# Patient Record
Sex: Male | Born: 1955 | Race: Black or African American | Hispanic: No | Marital: Single | Smoking: Never smoker
Health system: Southern US, Community
[De-identification: ages and names within clinical notes are randomized; demographics above are authoritative.]

## PROBLEM LIST (undated history)

## (undated) DIAGNOSIS — K449 Diaphragmatic hernia without obstruction or gangrene: Secondary | ICD-10-CM

## (undated) HISTORY — PX: SHOULDER SURGERY: SHX246

---

## 2010-08-09 ENCOUNTER — Ambulatory Visit: Payer: Worker's Compensation

## 2011-04-22 ENCOUNTER — Emergency Department (HOSPITAL_COMMUNITY): Payer: Medicaid Other

## 2011-04-22 ENCOUNTER — Emergency Department (HOSPITAL_COMMUNITY)
Admission: EM | Admit: 2011-04-22 | Discharge: 2011-04-22 | Disposition: A | Discharge status: Home / Self Care | Payer: Medicaid Other | Attending: Emergency Medicine | Admitting: Emergency Medicine

## 2011-04-22 ENCOUNTER — Encounter: Payer: Self-pay | Admitting: Emergency Medicine

## 2011-04-22 DIAGNOSIS — R209 Unspecified disturbances of skin sensation: Secondary | ICD-10-CM | POA: Insufficient documentation

## 2011-04-22 DIAGNOSIS — M5430 Sciatica, unspecified side: Secondary | ICD-10-CM | POA: Insufficient documentation

## 2011-04-22 DIAGNOSIS — M79609 Pain in unspecified limb: Secondary | ICD-10-CM | POA: Insufficient documentation

## 2011-04-22 DIAGNOSIS — M5432 Sciatica, left side: Secondary | ICD-10-CM

## 2011-04-22 MED ORDER — PREDNISONE 10 MG PO TABS: 20.0000 mg | ORAL_TABLET | Freq: Two times a day (BID) | ORAL | Status: DC
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
## 2011-04-22 NOTE — ED Notes (Signed)
Pt has pain to lt leg from buttocks area to lower leg with numbness states that it has been going on intermit for the past month

## 2011-04-22 NOTE — ED Notes (Signed)
Patient transported to X-ray and back to room.

## 2011-04-23 NOTE — ED Provider Notes (Signed)
Medical screening examination/treatment/procedure(s) were performed by non-physician practitioner and as supervising physician I was immediately available for consultation/collaboration.   Breigh Annett, MD 04/23/11 1839 

## 2012-07-27 ENCOUNTER — Emergency Department (HOSPITAL_COMMUNITY)
Admission: EM | Admit: 2012-07-27 | Discharge: 2012-07-27 | Disposition: A | Discharge status: Home / Self Care | Payer: Medicare Other | Attending: Emergency Medicine | Admitting: Emergency Medicine

## 2012-07-27 ENCOUNTER — Encounter (HOSPITAL_COMMUNITY): Payer: Self-pay | Admitting: *Deleted

## 2012-07-27 ENCOUNTER — Emergency Department (HOSPITAL_COMMUNITY): Payer: Medicare Other

## 2012-07-27 DIAGNOSIS — IMO0002 Reserved for concepts with insufficient information to code with codable children: Secondary | ICD-10-CM | POA: Insufficient documentation

## 2012-07-27 DIAGNOSIS — J45909 Unspecified asthma, uncomplicated: Secondary | ICD-10-CM | POA: Insufficient documentation

## 2012-07-27 DIAGNOSIS — Y9389 Activity, other specified: Secondary | ICD-10-CM | POA: Insufficient documentation

## 2012-07-27 DIAGNOSIS — S20219A Contusion of unspecified front wall of thorax, initial encounter: Secondary | ICD-10-CM

## 2012-07-27 DIAGNOSIS — Y929 Unspecified place or not applicable: Secondary | ICD-10-CM | POA: Insufficient documentation

## 2012-07-27 LAB — POCT I-STAT, CHEM 8
Calcium, Ion: 1.21 mmol/L (ref 1.12–1.23)
Glucose, Bld: 112 mg/dL — ABNORMAL HIGH (ref 70–99)
HCT: 48 % (ref 39.0–52.0)
Hemoglobin: 16.3 g/dL (ref 13.0–17.0)
TCO2: 28 mmol/L (ref 0–100)

## 2012-07-27 LAB — CBC
HCT: 42.4 % (ref 39.0–52.0)
MCH: 29.7 pg (ref 26.0–34.0)
MCHC: 35.1 g/dL (ref 30.0–36.0)
MCV: 84.5 fL (ref 78.0–100.0)
Platelets: 135 10*3/uL — ABNORMAL LOW (ref 150–400)
RDW: 12.2 % (ref 11.5–15.5)
WBC: 3.4 10*3/uL — ABNORMAL LOW (ref 4.0–10.5)

## 2012-07-27 LAB — POCT I-STAT TROPONIN I: Troponin i, poc: 0.01 ng/mL (ref 0.00–0.08)
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
## 2012-07-27 NOTE — ED Notes (Addendum)
Pt states he has intermittent central cp with no radiation for the past few days. Describes pain as pressure denies any pain at this moment but states the first day pain was a 7/10 but has decreased in severity since then. Pt reports he does not know if pain comes when he does any activity, he did not pay attention. Pt alert and oriented x 4. No distress at this time.

## 2012-07-27 NOTE — ED Notes (Signed)
Pt with no cardiac hx to ED c/o sternal chest pain that radiates to L chest.  Denies increased pain on inspiration, denies sob, diaphoresis.  States pain increased with palpation and when he stood.

## 2012-11-13 ENCOUNTER — Emergency Department (HOSPITAL_COMMUNITY): Payer: Medicare Other

## 2012-11-13 ENCOUNTER — Encounter (HOSPITAL_COMMUNITY): Payer: Self-pay | Admitting: *Deleted

## 2012-11-13 ENCOUNTER — Emergency Department (HOSPITAL_COMMUNITY)
Admission: EM | Admit: 2012-11-13 | Discharge: 2012-11-13 | Disposition: A | Discharge status: Home / Self Care | Payer: Medicare Other | Attending: Emergency Medicine | Admitting: Emergency Medicine

## 2012-11-13 DIAGNOSIS — M545 Low back pain, unspecified: Secondary | ICD-10-CM | POA: Insufficient documentation

## 2012-11-13 DIAGNOSIS — M549 Dorsalgia, unspecified: Secondary | ICD-10-CM

## 2012-11-13 DIAGNOSIS — Z9889 Other specified postprocedural states: Secondary | ICD-10-CM | POA: Insufficient documentation

## 2012-11-13 DIAGNOSIS — Z79899 Other long term (current) drug therapy: Secondary | ICD-10-CM | POA: Insufficient documentation

## 2012-11-13 DIAGNOSIS — J45909 Unspecified asthma, uncomplicated: Secondary | ICD-10-CM | POA: Insufficient documentation

## 2012-11-13 MED ORDER — CYCLOBENZAPRINE HCL 10 MG PO TABS: 10.0000 mg | ORAL_TABLET | Freq: Three times a day (TID) | ORAL | Status: DC | PRN

## 2012-11-13 MED ORDER — DIAZEPAM 5 MG PO TABS
5.0000 mg | ORAL_TABLET | Freq: Once | ORAL | Status: AC
  Administered 2012-11-13: 5 mg via ORAL
  Filled 2012-11-13: qty 1

## 2012-11-13 MED ORDER — NAPROXEN 500 MG PO TABS: 500.0000 mg | ORAL_TABLET | Freq: Two times a day (BID) | ORAL | Status: DC

## 2012-11-13 MED ORDER — KETOROLAC TROMETHAMINE 60 MG/2ML IM SOLN
60.0000 mg | Freq: Once | INTRAMUSCULAR | Status: DC
  Filled 2012-11-13: qty 2

## 2012-11-13 NOTE — ED Provider Notes (Signed)
History    CSN: [id number] Arrival date & time 11/13/12  0844  First MD Initiated Contact with Patient 11/13/12 0901     Chief Complaint  Patient presents with  . Back Pain   (Consider location/radiation/quality/duration/timing/severity/associated sxs/prior Treatment) HPI Comments: Patient is a 57 year old male who presents with sudden onset of lower back pain that started 3 weeks ago after pulling a heavy suitcase uphill. The pain is aching and severe and does not radiate. The pain is constant. Movement makes the pain worse. Nothing makes the pain better. Patient has tried applying heat to the affected area which has provided some relief. No associated symptoms. No saddles paresthesias or bladder/bowel incontinence.     Past Medical History  Diagnosis Date  . Asthma    Past Surgical History  Procedure Laterality Date  . Shoulder surgery Left    History reviewed. No pertinent family history. History  Substance Use Topics  . Smoking status: Never Smoker   . Smokeless tobacco: Not on file  . Alcohol Use: No    Review of Systems  Musculoskeletal: Positive for back pain.  All other systems reviewed and are negative.    Allergies  Review of patient's allergies indicates no known allergies.  Home Medications   Current Outpatient Rx  Name  Route  Sig  Dispense  Refill  . Cholecalciferol (VITAMIN D-3) 5000 UNITS TABS   Oral   Take 5,000 Units by mouth every morning.         . Multiple Vitamin (MULTIVITAMIN WITH MINERALS) TABS   Oral   Take 1 tablet by mouth 2 (two) times daily.          . naproxen sodium (ALEVE) 220 MG tablet   Oral   Take 440 mg by mouth every 8 (eight) hours as needed (For back pain.).         Marland Kitchen tetrahydrozoline 0.05 % ophthalmic solution   Both Eyes   Place 1-2 drops into both eyes 4 (four) times daily as needed (For allergies.).         Marland Kitchen vitamin C (ASCORBIC ACID) 500 MG tablet   Oral   Take 500 mg by mouth 2 (two) times daily.          BP 132/88  Pulse 57  Temp(Src) 98.7 F (37.1 C) (Oral)  Resp 16  SpO2 99% Physical Exam  Nursing note and vitals reviewed. Constitutional: He is oriented to person, place, and time. He appears well-developed and well-nourished. No distress.  HENT:  Head: Normocephalic and atraumatic.  Eyes: Conjunctivae are normal.  Neck: Normal range of motion.  Cardiovascular: Normal rate and regular rhythm.  Exam reveals no gallop and no friction rub.   No murmur heard. Pulmonary/Chest: Effort normal and breath sounds normal. He has no wheezes. He has no rales. He exhibits no tenderness.  Abdominal: Soft. He exhibits no distension. There is no tenderness. There is no rebound.  Musculoskeletal: Normal range of motion.  No midline spine tenderness. Left lower paraspinal tenderness to palpation.   Neurological: He is alert and oriented to person, place, and time. Coordination normal.  Lower extremity strength and sensation equal and intact bilaterally. Speech is goal-oriented. Moves limbs without ataxia.   Skin: Skin is warm and dry.  Psychiatric: He has a normal mood and affect. His behavior is normal.    ED Course  Procedures (including critical care time) Labs Reviewed - No data to display Dg Lumbar Spine Complete  11/13/2012   *RADIOLOGY  REPORT*  Clinical Data: Back pain.  LUMBAR SPINE - COMPLETE 4+ VIEW  Comparison: No priors.  Findings: Five views of the lumbar spine demonstrate no definite acute displaced fractures or compression type fractures.  There is multilevel degenerative disc disease, most severe at L5-S1. Multilevel facet arthropathy is also noted throughout the lumbar spine.  5 mm of retrolisthesis of L5 upon S1.  Alignment is otherwise anatomic.  No definite defects of the pars interarticularis are noted.  IMPRESSION: 1.  No acute radiographic abnormality of the lumbar spine. 2.  Mild multilevel degenerative disc disease and lumbar spondylosis, as above, including 5 mm of  retrolisthesis of L5 upon S1.   Original Report Authenticated By: Trudie Reed, M.D.   1. Back pain     MDM  9:41 AM Patient will have Toradol and valium for pain. Xray of lumbar spine pending.   10:12 AM Xray unremarkable for acute changes. Patient will have Naprosyn and Flexeril prescriptions for pain. Patient likely having pain from muscle strain. No bladder/bowel incontinence or saddle paresthesias. Vitals stable and patient afebrile.   Emilia Beck, PA-C 11/13/12 1018

## 2012-11-13 NOTE — ED Provider Notes (Signed)
Medical screening examination/treatment/procedure(s) were performed by non-physician practitioner and as supervising physician I was immediately available for consultation/collaboration.  Yomaris Palecek, MD 11/13/12 1558 

## 2012-11-13 NOTE — ED Notes (Signed)
Pt reports back pain x3 weeks, thinks he hurt his back from pulling heavy suitcase. Reports pain is not allowing him to sleep. Pain is okay with sitting up.

## 2014-01-27 ENCOUNTER — Encounter (HOSPITAL_COMMUNITY): Payer: Self-pay | Admitting: Emergency Medicine

## 2014-01-27 ENCOUNTER — Emergency Department (HOSPITAL_COMMUNITY)
Admission: EM | Admit: 2014-01-27 | Discharge: 2014-01-27 | Disposition: A | Discharge status: Home / Self Care | Payer: Medicare HMO | Attending: Emergency Medicine | Admitting: Emergency Medicine

## 2014-01-27 DIAGNOSIS — Z79899 Other long term (current) drug therapy: Secondary | ICD-10-CM | POA: Insufficient documentation

## 2014-01-27 DIAGNOSIS — R110 Nausea: Secondary | ICD-10-CM | POA: Diagnosis present

## 2014-01-27 DIAGNOSIS — J45909 Unspecified asthma, uncomplicated: Secondary | ICD-10-CM | POA: Insufficient documentation

## 2014-01-27 DIAGNOSIS — Z791 Long term (current) use of non-steroidal anti-inflammatories (NSAID): Secondary | ICD-10-CM | POA: Diagnosis not present

## 2014-01-27 LAB — CBC WITH DIFFERENTIAL/PLATELET
BASOS ABS: 0 10*3/uL (ref 0.0–0.1)
Basophils Relative: 1 % (ref 0–1)
Eosinophils Absolute: 0.1 10*3/uL (ref 0.0–0.7)
Eosinophils Relative: 2 % (ref 0–5)
HEMATOCRIT: 43.8 % (ref 39.0–52.0)
HEMOGLOBIN: 15.2 g/dL (ref 13.0–17.0)
LYMPHS PCT: 49 % — AB (ref 12–46)
Lymphs Abs: 1.6 10*3/uL (ref 0.7–4.0)
MCH: 29.8 pg (ref 26.0–34.0)
MCHC: 34.7 g/dL (ref 30.0–36.0)
MCV: 85.9 fL (ref 78.0–100.0)
MONO ABS: 0.1 10*3/uL (ref 0.1–1.0)
MONOS PCT: 5 % (ref 3–12)
NEUTROS ABS: 1.3 10*3/uL — AB (ref 1.7–7.7)
Neutrophils Relative %: 43 % (ref 43–77)
Platelets: 161 10*3/uL (ref 150–400)
RBC: 5.1 MIL/uL (ref 4.22–5.81)
RDW: 11.3 % — ABNORMAL LOW (ref 11.5–15.5)
WBC: 3.1 10*3/uL — AB (ref 4.0–10.5)

## 2014-01-27 LAB — COMPREHENSIVE METABOLIC PANEL
ALT: 13 U/L (ref 0–53)
AST: 20 U/L (ref 0–37)
Albumin: 4.1 g/dL (ref 3.5–5.2)
Alkaline Phosphatase: 67 U/L (ref 39–117)
Anion gap: 13 (ref 5–15)
BUN: 11 mg/dL (ref 6–23)
CO2: 25 mEq/L (ref 19–32)
Calcium: 9.4 mg/dL (ref 8.4–10.5)
Chloride: 105 mEq/L (ref 96–112)
Creatinine, Ser: 1.11 mg/dL (ref 0.50–1.35)
GFR calc Af Amer: 84 mL/min — ABNORMAL LOW (ref >90)
GFR calc non Af Amer: 72 mL/min — ABNORMAL LOW (ref >90)
Glucose, Bld: 85 mg/dL (ref 70–99)
Potassium: 4.2 mEq/L (ref 3.7–5.3)
Sodium: 143 mEq/L (ref 137–147)
Total Bilirubin: 1 mg/dL (ref 0.3–1.2)
Total Protein: 7.7 g/dL (ref 6.0–8.3)

## 2014-01-27 LAB — I-STAT TROPONIN, ED
Troponin i, poc: 0 ng/mL (ref 0.00–0.08)
Troponin i, poc: 0 ng/mL (ref 0.00–0.08)

## 2014-01-27 MED ORDER — ONDANSETRON 4 MG PO TBDP
4.0000 mg | ORAL_TABLET | Freq: Once | ORAL | Status: AC
  Administered 2014-01-27: 4 mg via ORAL
  Filled 2014-01-27: qty 1

## 2014-01-27 MED ORDER — ONDANSETRON HCL 4 MG PO TABS: 4.0000 mg | ORAL_TABLET | Freq: Four times a day (QID) | ORAL | Status: DC

## 2014-01-27 NOTE — ED Notes (Signed)
Pt monitored by pulse ox, bp cuff, and 12-lead. 

## 2014-01-27 NOTE — ED Provider Notes (Signed)
CSN: [id number]     Arrival date & time 01/27/14  1553 History   First MD Initiated Contact with Patient 01/27/14 1657     Chief Complaint  Patient presents with  . Nausea     (Consider location/radiation/quality/duration/timing/severity/associated sxs/prior Treatment) HPI  A 58 year old male with history of asthma who presents complaining of nausea. Patient reports he drinks carrot juice on a regular basis. Today he went to the gas station and got another carrot juice to drink.  While drinking pt felt there's something in the carrot juice "I see thing floating in it and it tastes bad, not like it usually taste".  Shortly after drinking the juice he felt nauseated.  He report "there's a gasoline smell to it and i would like to know what it is".  Despite nausea, and spitting up the carrot juice approximately 2-3 hrs ago he denies vomiting.  Approximately an hr ago while waiting in the ER pt report feeling SOB and having CP.  CP is midsternal, non radiating, no associated lightheadedness, dizziness, or diaphoresis.  Does not have any significant cardiac history, pt is a non smoker, no hx of diabetes.  Pain has subsided.  Denies any active shortness of breath. Patient did report [place] up to [place] 2 weeks ago by car but has no complications. No active cancer, no prior history of PE or DVT, no recent surgery, no unilateral leg swelling or calf pain.  Furthermore patient denies fever, active vomiting, diarrhea, abdominal cramping, or rash. Patient brought the bottle of carrot juice with him.  Past Medical History  Diagnosis Date  . Asthma    Past Surgical History  Procedure Laterality Date  . Shoulder surgery Left    History reviewed. No pertinent family history. History  Substance Use Topics  . Smoking status: Never Smoker   . Smokeless tobacco: Not on file  . Alcohol Use: No    Review of Systems  All other systems reviewed and are negative.     Allergies  Review of  patient's allergies indicates no known allergies.  Home Medications   Prior to Admission medications   Medication Sig Start Date End Date Taking? Authorizing Provider  Cholecalciferol (VITAMIN D-3) 5000 UNITS TABS Take 5,000 Units by mouth every morning.    Historical Provider, MD  cyclobenzaprine (FLEXERIL) 10 MG tablet Take 1 tablet (10 mg total) by mouth 3 (three) times daily as needed for muscle spasms. 11/13/12   Emilia BeckKaitlyn Szekalski, PA-C  Multiple Vitamin (MULTIVITAMIN WITH MINERALS) TABS Take 1 tablet by mouth 2 (two) times daily.     Historical Provider, MD  naproxen (NAPROSYN) 500 MG tablet Take 1 tablet (500 mg total) by mouth 2 (two) times daily with a meal. 11/13/12   Emilia BeckKaitlyn Szekalski, PA-C  naproxen sodium (ALEVE) 220 MG tablet Take 440 mg by mouth every 8 (eight) hours as needed (For back pain.).    Historical Provider, MD  tetrahydrozoline 0.05 % ophthalmic solution Place 1-2 drops into both eyes 4 (four) times daily as needed (For allergies.).    Historical Provider, MD  vitamin C (ASCORBIC ACID) 500 MG tablet Take 500 mg by mouth 2 (two) times daily.     Historical Provider, MD   BP 128/83  Pulse 84  Temp(Src) 98.4 F (36.9 C) (Oral)  Resp 18  Ht 5\' 11"  (1.803 m)  Wt 197 lb 9 oz (89.614 kg)  BMI 27.57 kg/m2  SpO2 97% Physical Exam  Constitutional: He is oriented to person, place, and time. He  appears well-developed and well-nourished. No distress.  HENT:  Head: Atraumatic.  Mouth/Throat: Oropharynx is clear and moist.  Eyes: Conjunctivae are normal.  Neck: Normal range of motion. Neck supple.  Cardiovascular: Normal rate and regular rhythm.   Pulmonary/Chest: Effort normal and breath sounds normal.  Abdominal: Soft. There is no tenderness.  Neurological: He is alert and oriented to person, place, and time.  Skin: No rash noted.  Psychiatric: He has a normal mood and affect.    ED Course  Procedures (including critical care time)  5:17 PM Pt developed nausea  after he drank carrot juice from a gas station.  Pt brought the bottle with him.  The juice will expired on 02/19/2014.  However there are a lot of sedimentation in the bottle, which is unusual according to him.  Will give zofran.  Pt has transient cp/sob that is atypical for ACS.  sxs likely from anxiety of drinking the carrot juice.  TIMI 0.  PERC negative.  Reassurance given.  Stable for discharge.  Return precaution discussed.     Labs Review Labs Reviewed  CBC WITH DIFFERENTIAL - Abnormal; Notable for the following:    WBC 3.1 (*)    RDW 11.3 (*)    Neutro Abs 1.3 (*)    Lymphocytes Relative 49 (*)    All other components within normal limits  COMPREHENSIVE METABOLIC PANEL - Abnormal; Notable for the following:    GFR calc non Af Amer 72 (*)    GFR calc Af Amer 84 (*)    All other components within normal limits  I-STAT TROPOININ, ED  I-STAT TROPOININ, ED    Imaging Review No results found.   EKG Interpretation   Date/Time:  Monday January 27 2014 16:13:23 EDT Ventricular Rate:  77 PR Interval:  180 QRS Duration: 84 QT Interval:  356 QTC Calculation: 402 R Axis:   102 Text Interpretation:  Normal sinus rhythm Rightward axis Borderline ECG  When compared with ECG of 07/27/2012 No significant change was found  Confirmed by [facility]  MD, Nicholos Johns [phone]) on 01/27/2014 5:39:51 PM      MDM   Final diagnoses:  Nausea    BP 123/72  Pulse 58  Temp(Src) 98.4 F (36.9 C) (Oral)  Resp 16  Ht 5\' 11"  (1.803 m)  Wt 197 lb 9 oz (89.614 kg)  BMI 27.57 kg/m2  SpO2 99%  I have reviewed nursing notes and vital signs.  I reviewed available ER/hospitalization records thought the EMR     Fayrene Helper, [place] 01/27/14 6045

## 2014-01-27 NOTE — Discharge Instructions (Signed)

## 2014-01-27 NOTE — ED Notes (Signed)
Pt states "I am still doing the same thing--spitting up a little" denies pain, asking when he would be seen, because he would like to leave before dark-- driving.

## 2014-01-27 NOTE — ED Notes (Addendum)
Presents with sudden onset of nausea and "spitting after I drank some carrot juice 2 hours ago. There is something in the juice. I see things floating in it and it tastes bad. Not like it usually does. It tastes medicine like" denies pain event occurred 2 hours ago.  "It has a gasoline type smell to it" denies pain, endorses SOB. HE believes there is something in the carrot juice that is not supposed to be in it and would like to know what it is.

## 2014-01-27 NOTE — ED Notes (Signed)
Patient dressed and in the hallway. Patient states, "I need to leave because I cannot drive in the dark. Nothing has been done for me and I will just go see my PCP." Patient made aware of the treatment that took place. PA printing discharge papers.

## 2014-01-29 NOTE — ED Provider Notes (Signed)
Medical screening examination/treatment/procedure(s) were performed by non-physician practitioner and as supervising physician I was immediately available for consultation/collaboration.   EKG Interpretation   Date/Time:  Monday January 27 2014 16:13:23 EDT Ventricular Rate:  77 PR Interval:  180 QRS Duration: 84 QT Interval:  356 QTC Calculation: 402 R Axis:   102 Text Interpretation:  Normal sinus rhythm Rightward axis Borderline ECG  When compared with ECG of 07/27/2012 No significant change was found  Conf[facility] by [facility]  [facility], Ramiah Helfrich ([id number]) on 01/27/2014 5:39:51 PM        Samuel JesterKathleen Leili Eskenazi, DO 01/29/14 1627

## 2014-02-21 ENCOUNTER — Encounter (HOSPITAL_COMMUNITY): Payer: Self-pay | Admitting: Emergency Medicine

## 2014-02-21 ENCOUNTER — Emergency Department (HOSPITAL_COMMUNITY)
Admission: EM | Admit: 2014-02-21 | Discharge: 2014-02-21 | Disposition: A | Discharge status: Home / Self Care | Payer: Medicare HMO | Attending: Emergency Medicine | Admitting: Emergency Medicine

## 2014-02-21 DIAGNOSIS — Z79899 Other long term (current) drug therapy: Secondary | ICD-10-CM | POA: Diagnosis not present

## 2014-02-21 DIAGNOSIS — R1013 Epigastric pain: Secondary | ICD-10-CM | POA: Insufficient documentation

## 2014-02-21 DIAGNOSIS — Z8719 Personal history of other diseases of the digestive system: Secondary | ICD-10-CM | POA: Insufficient documentation

## 2014-02-21 DIAGNOSIS — R1012 Left upper quadrant pain: Secondary | ICD-10-CM

## 2014-02-21 DIAGNOSIS — J45909 Unspecified asthma, uncomplicated: Secondary | ICD-10-CM | POA: Diagnosis not present

## 2014-02-21 DIAGNOSIS — R7889 Finding of other specified substances, not normally found in blood: Secondary | ICD-10-CM | POA: Insufficient documentation

## 2014-02-21 HISTORY — DX: Diaphragmatic hernia without obstruction or gangrene: K44.9

## 2014-02-21 LAB — CBC WITH DIFFERENTIAL/PLATELET
BASOS ABS: 0 10*3/uL (ref 0.0–0.1)
Basophils Relative: 0 % (ref 0–1)
EOS ABS: 0.1 10*3/uL (ref 0.0–0.7)
Eosinophils Relative: 2 % (ref 0–5)
HCT: 40.2 % (ref 39.0–52.0)
Hemoglobin: 13.8 g/dL (ref 13.0–17.0)
LYMPHS ABS: 1.9 10*3/uL (ref 0.7–4.0)
Lymphocytes Relative: 33 % (ref 12–46)
MCH: 30.3 pg (ref 26.0–34.0)
MCHC: 34.3 g/dL (ref 30.0–36.0)
MCV: 88.4 fL (ref 78.0–100.0)
Monocytes Absolute: 0.4 10*3/uL (ref 0.1–1.0)
Monocytes Relative: 7 % (ref 3–12)
Neutro Abs: 3.3 10*3/uL (ref 1.7–7.7)
Neutrophils Relative %: 58 % (ref 43–77)
PLATELETS: 130 10*3/uL — AB (ref 150–400)
RBC: 4.55 MIL/uL (ref 4.22–5.81)
RDW: 11.6 % (ref 11.5–15.5)
WBC: 5.7 10*3/uL (ref 4.0–10.5)

## 2014-02-21 LAB — COMPREHENSIVE METABOLIC PANEL
ALK PHOS: 76 U/L (ref 39–117)
ALT: 17 U/L (ref 0–53)
AST: 25 U/L (ref 0–37)
Albumin: 3.7 g/dL (ref 3.5–5.2)
Anion gap: 10 (ref 5–15)
BILIRUBIN TOTAL: 0.8 mg/dL (ref 0.3–1.2)
BUN: 9 mg/dL (ref 6–23)
CALCIUM: 9.2 mg/dL (ref 8.4–10.5)
CHLORIDE: 100 meq/L (ref 96–112)
CO2: 28 meq/L (ref 19–32)
Creatinine, Ser: 1.11 mg/dL (ref 0.50–1.35)
GFR, EST AFRICAN AMERICAN: 84 mL/min — AB (ref >90)
GFR, EST NON AFRICAN AMERICAN: 72 mL/min — AB (ref >90)
GLUCOSE: 125 mg/dL — AB (ref 70–99)
POTASSIUM: 5.1 meq/L (ref 3.7–5.3)
SODIUM: 138 meq/L (ref 137–147)
Total Protein: 6.9 g/dL (ref 6.0–8.3)

## 2014-02-21 LAB — D-DIMER, QUANTITATIVE (NOT AT ARMC): D DIMER QUANT: 0.53 ug{FEU}/mL — AB (ref 0.00–0.48)

## 2014-02-21 LAB — LIPASE, BLOOD: Lipase: 32 U/L (ref 11–59)

## 2014-02-21 LAB — TROPONIN I: Troponin I: <0.3 ng/mL (ref <0.30)

## 2014-02-21 MED ORDER — PANTOPRAZOLE SODIUM 20 MG PO TBEC: 40.0000 mg | DELAYED_RELEASE_TABLET | Freq: Every day | ORAL | Status: AC

## 2014-02-21 MED ORDER — GI COCKTAIL ~~LOC~~
30.0000 mL | Freq: Once | ORAL | Status: AC
  Administered 2014-02-21: 30 mL via ORAL
  Filled 2014-02-21: qty 30

## 2014-02-21 MED ORDER — ONDANSETRON HCL 4 MG/2ML IJ SOLN
4.0000 mg | Freq: Once | INTRAMUSCULAR | Status: DC
  Filled 2014-02-21: qty 2

## 2014-02-21 MED ORDER — PANTOPRAZOLE SODIUM 40 MG IV SOLR
40.0000 mg | Freq: Once | INTRAVENOUS | Status: AC
  Administered 2014-02-21: 40 mg via INTRAVENOUS
  Filled 2014-02-21: qty 40

## 2014-02-21 MED ORDER — DICYCLOMINE HCL 20 MG PO TABS: 20.0000 mg | ORAL_TABLET | Freq: Four times a day (QID) | ORAL | Status: AC | PRN

## 2014-02-21 MED ORDER — DICYCLOMINE HCL 20 MG PO TABS
20.0000 mg | ORAL_TABLET | Freq: Once | ORAL | Status: AC
  Administered 2014-02-21: 20 mg via ORAL
  Filled 2014-02-21: qty 1

## 2014-02-21 NOTE — ED Notes (Signed)
Patient c/o LUQ pain that he describes as "chest pain". Patient reports a history of "stomach problems" and states this pain occurs after he takes Zofran. Patient with Zofran bottle on his person. Patient states this pain started at 2000.

## 2014-02-21 NOTE — Discharge Instructions (Signed)

## 2014-02-21 NOTE — ED Provider Notes (Signed)
CSN: [id number]     Arrival date & time 02/21/14  0123 History   First MD Initiated Contact with Patient 02/21/14 0234     Chief Complaint  Patient presents with  . Abdominal Pain    LUQ     (Consider location/radiation/quality/duration/timing/severity/associated sxs/prior Treatment) HPI 58 year old male presents to the emergency department from home with complaint of left upper quadrant abdominal pain.  Symptoms started on Wednesday, and has been intermittent since that time.  Patient reports his stomach is upset.  He reports a crampy type pain.  He reports nausea.  He reports that taking Zofran worsened his symptoms.  He reports history of hiatal hernia.  He has controlled symptoms from his hiatal hernia hernia in the past by "eating right".  He is not on any other medications.  He denies any vomiting.  No fevers or chills.  Normal bowel movements.  He reports he has had a colonoscopy in the past but it is been some time.  Patient reports some radiation of his left upper quadrant pain into his chest.  He denies any shortness of breath.  No prolonged immobilization, no surgery, no leg swelling or pain.  No history of PE or DVT. Past Medical History  Diagnosis Date  . Asthma   . Hiatal hernia    Past Surgical History  Procedure Laterality Date  . Shoulder surgery Left    No family history on file. History  Substance Use Topics  . Smoking status: Never Smoker   . Smokeless tobacco: Not on file  . Alcohol Use: No    Review of Systems   See History of Present Illness; otherwise all other systems are reviewed and negative  Allergies  Review of patient's allergies indicates no known allergies.  Home Medications   Prior to Admission medications   Medication Sig Start Date End Date Taking? Authorizing Provider  Ascorbic Acid (VITAMIN C) 1000 MG tablet Take 1,000 mg by mouth daily.   Yes Historical Provider, MD  Multiple Vitamin (MULTIVITAMIN WITH MINERALS) TABS tablet Take 1  tablet by mouth daily.   Yes Historical Provider, MD  ondansetron (ZOFRAN) 4 MG tablet Take 4 mg by mouth every 6 (six) hours as needed for nausea or vomiting.   Yes Historical Provider, MD   BP 134/80  Pulse 68  Temp(Src) 97.7 F (36.5 C) (Oral)  Resp 14  SpO2 99% Physical Exam  Nursing note and vitals reviewed. Constitutional: He is oriented to person, place, and time. He appears well-developed and well-nourished.  HENT:  Head: Normocephalic and atraumatic.  Nose: Nose normal.  Mouth/Throat: Oropharynx is clear and moist.  Eyes: Conjunctivae and EOM are normal. Pupils are equal, round, and reactive to light.  Neck: Normal range of motion. Neck supple. No JVD present. No tracheal deviation present. No thyromegaly present.  Cardiovascular: Normal rate, regular rhythm, normal heart sounds and intact distal pulses.  Exam reveals no gallop and no friction rub.   No murmur heard. Pulmonary/Chest: Effort normal and breath sounds normal. No stridor. No respiratory distress. He has no wheezes. He has no rales. He exhibits no tenderness.  Abdominal: Soft. Bowel sounds are normal. He exhibits no distension and no mass. There is tenderness (epigastric and left upper quadrant pain with palpation). There is no rebound and no guarding.  Musculoskeletal: Normal range of motion. He exhibits no edema and no tenderness.  Lymphadenopathy:    He has no cervical adenopathy.  Neurological: He is alert and oriented to person, place, and time.  He displays normal reflexes. He exhibits normal muscle tone. Coordination normal.  Skin: Skin is warm and dry. No rash noted. No erythema. No pallor.  Psychiatric: He has a normal mood and affect. His behavior is normal. Judgment and thought content normal.    ED Course  Procedures (including critical care time) Labs Review Labs Reviewed  COMPREHENSIVE METABOLIC PANEL - Abnormal; Notable for the following:    Glucose, Bld 125 (*)    GFR calc non Af Amer 72 (*)     GFR calc Af Amer 84 (*)    All other components within normal limits  CBC WITH DIFFERENTIAL - Abnormal; Notable for the following:    Platelets 130 (*)    All other components within normal limits  D-DIMER, QUANTITATIVE - Abnormal; Notable for the following:    D-Dimer, Quant 0.53 (*)    All other components within normal limits  LIPASE, BLOOD  TROPONIN I  CBC WITH DIFFERENTIAL    Imaging Review No results found.   EKG Interpretation None      MDM   Final diagnoses:  LUQ abdominal pain   58 yo male with left upper quadrant abdominal pain.  D-dimer mildly elevated, do not feel symptoms are reflective of PE.  Patient reports no change with GI cocktail.  Patient very reluctant to take any medications ordered as he is afraid of side effects from each medication.  Patient has follow-up with his primary care doctor next week.  Abdomen is soft, no rebound or guarding.  Doubt diverticulitis, intestinal ischemia, PE, ruptured viscus.  Plan to start on Bentyl and Protonix and have him follow-up with his primary care doctor as scheduled  Olivia Mackielga M Alyxandria Wentz, MD 02/21/14 [phone]

## 2014-11-12 IMAGING — CR DG LUMBAR SPINE COMPLETE 4+V
5 series · 5 of 5 positions shown · non-contrast
Comparison: No priors.

CLINICAL DATA: Back pain.

LUMBAR SPINE - COMPLETE 4+ VIEW

[t lumbar spine ap]
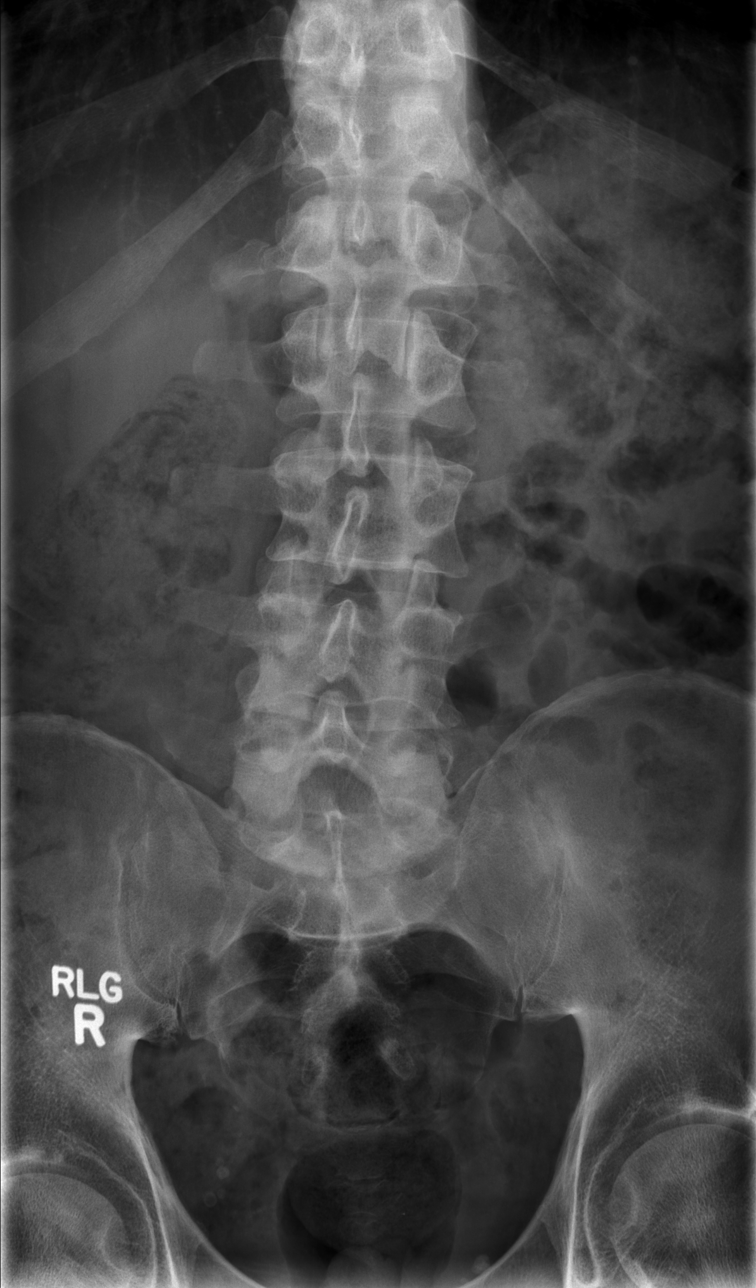

[t lumbar spine obl (1 of 2)]
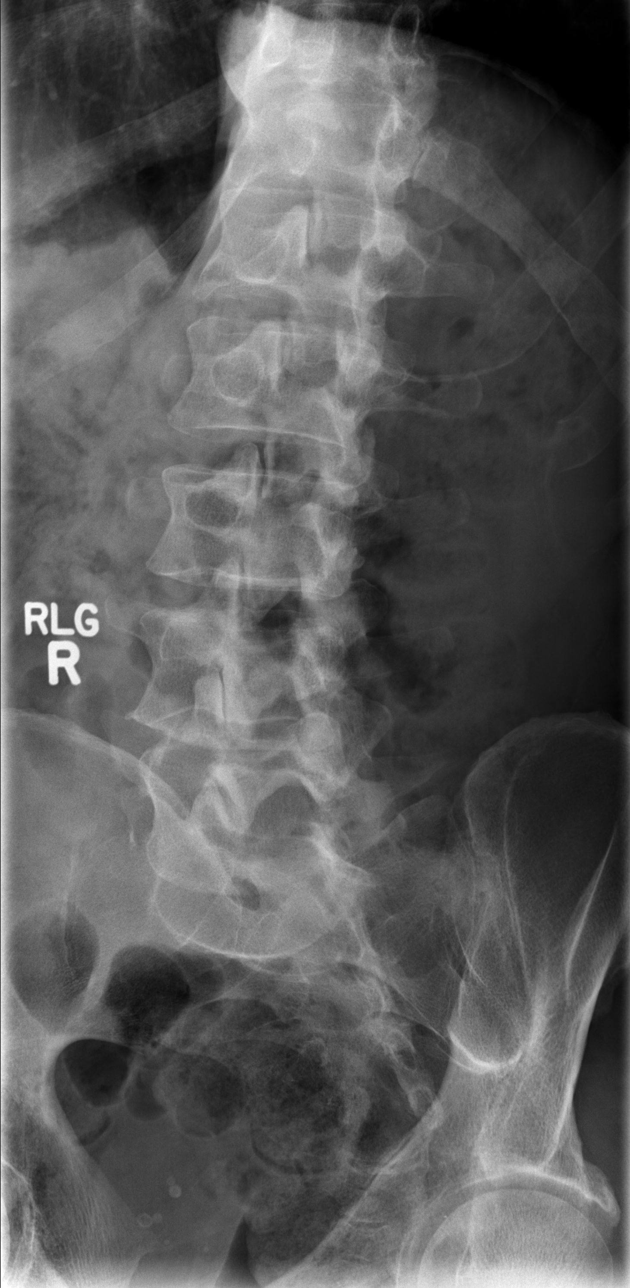

[t lumbar spine obl (2 of 2)]
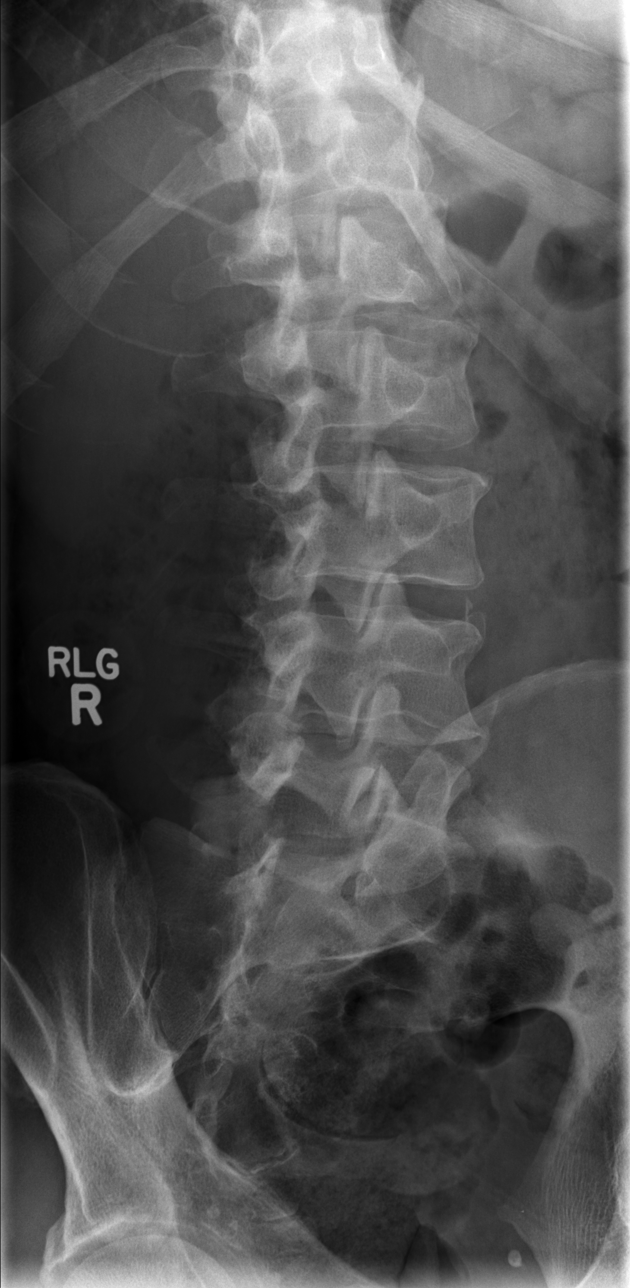

[t lumbar spine lat]
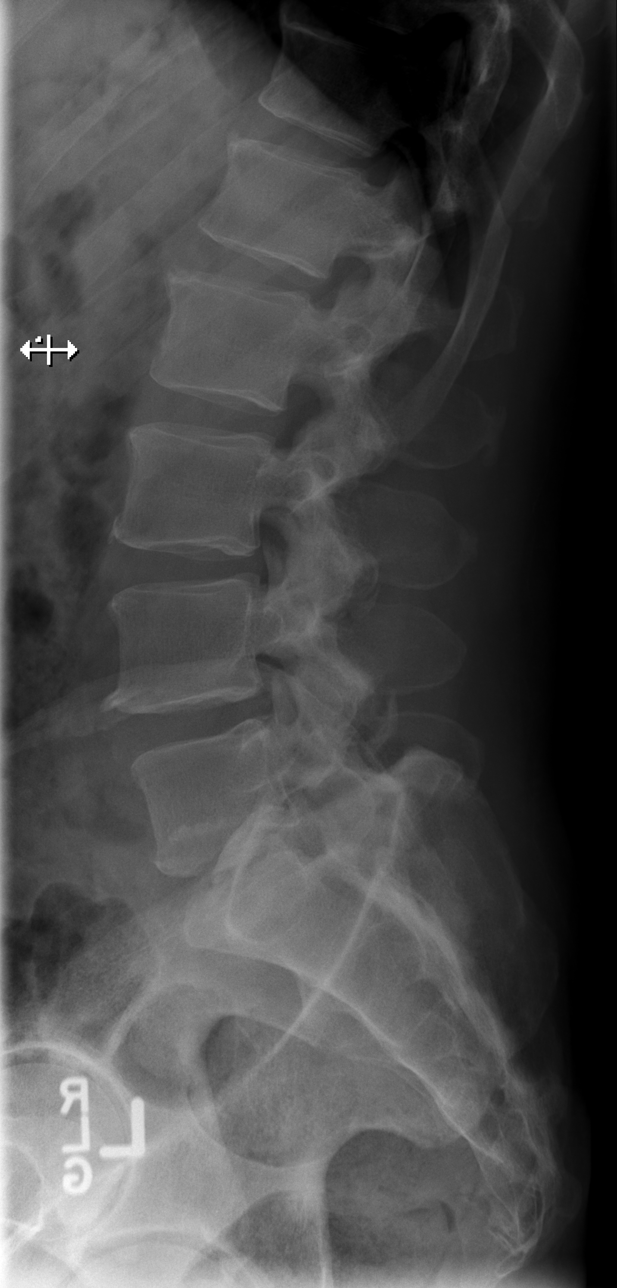

[t lumbar l-5 s-1 spot]
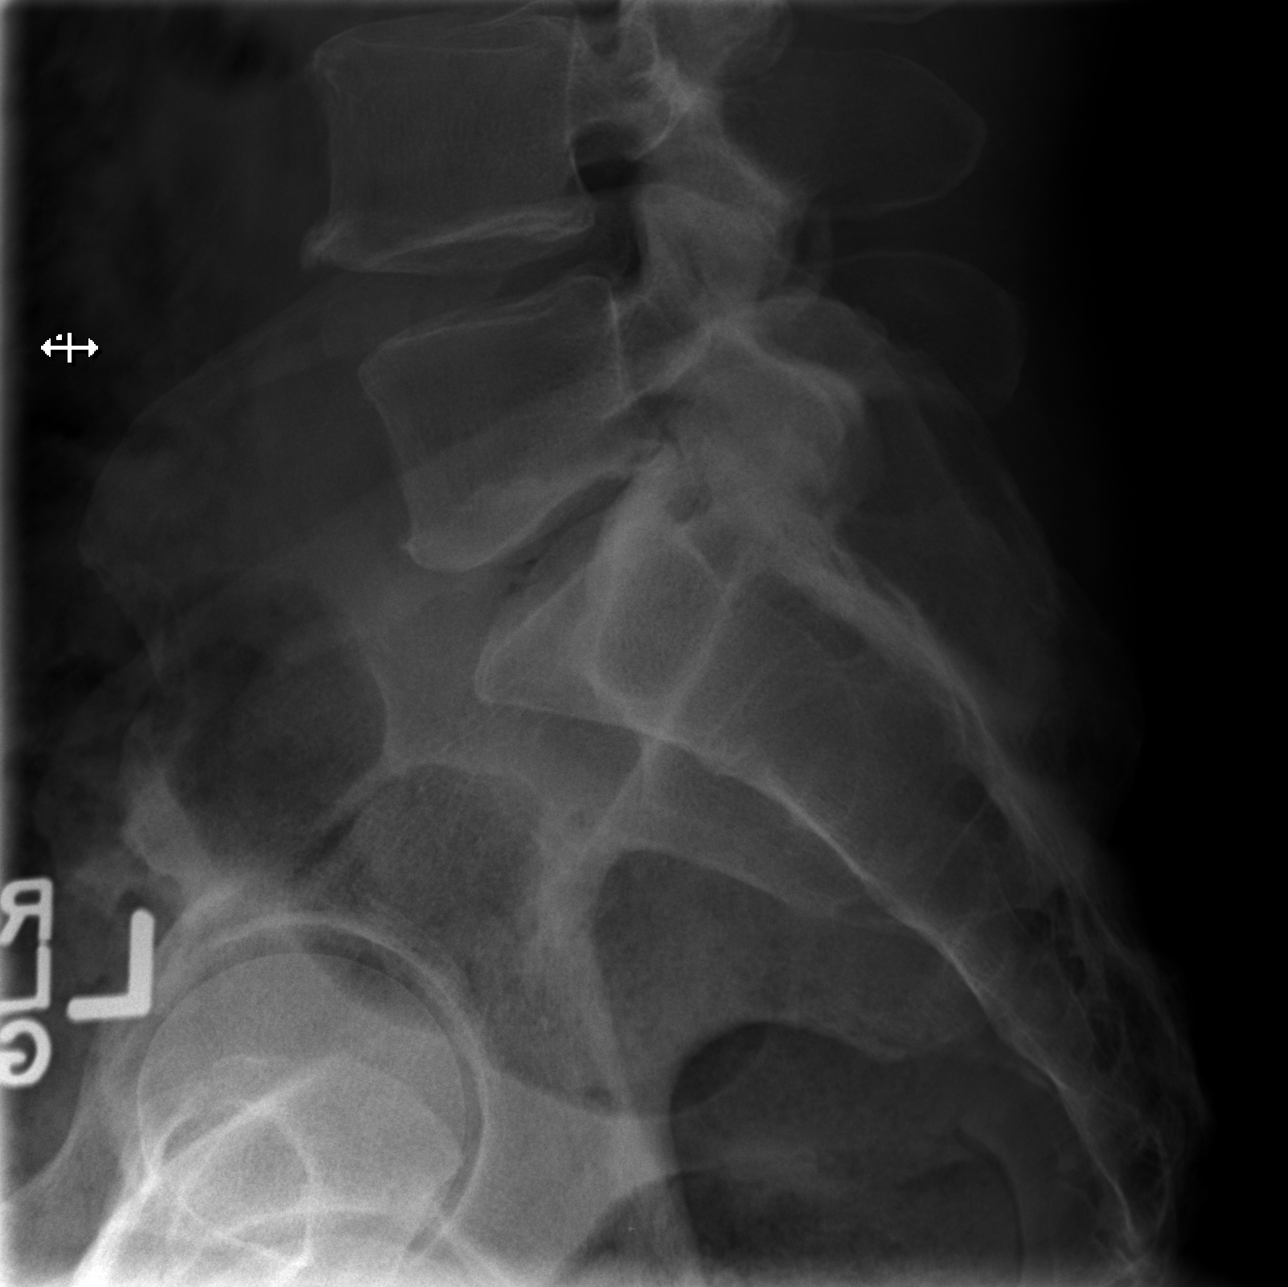

[5 of 5 positions shown; findings below may reference images not displayed]

FINDINGS: Five views of the lumbar spine demonstrate no definite
acute displaced fractures or compression type fractures.  There is
multilevel degenerative disc disease, most severe at L5-S1.
Multilevel facet arthropathy is also noted throughout the lumbar
spine.  5 mm of retrolisthesis of L5 upon S1.  Alignment is
otherwise anatomic.  No definite defects of the pars
interarticularis are noted.
IMPRESSION: 1.  No acute radiographic abnormality of the lumbar spine.
2.  Mild multilevel degenerative disc disease and lumbar
spondylosis, as above, including 5 mm of retrolisthesis of L5 upon
S1.
